# Patient Record
Sex: Male | Born: 2000 | Race: Black or African American | Hispanic: No | Marital: Single | Smoking: Never smoker
Health system: Southern US, Community
[De-identification: ages and names within clinical notes are randomized; demographics above are authoritative.]

---

## 2020-04-18 ENCOUNTER — Emergency Department (HOSPITAL_COMMUNITY): Payer: Self-pay

## 2020-04-18 ENCOUNTER — Emergency Department (HOSPITAL_COMMUNITY)
Admission: EM | Admit: 2020-04-18 | Discharge: 2020-04-18 | Disposition: A | Discharge status: Home / Self Care | Payer: Self-pay | Attending: Emergency Medicine | Admitting: Emergency Medicine

## 2020-04-18 ENCOUNTER — Encounter (HOSPITAL_COMMUNITY): Payer: Self-pay

## 2020-04-18 DIAGNOSIS — W500XXA Accidental hit or strike by another person, initial encounter: Secondary | ICD-10-CM | POA: Insufficient documentation

## 2020-04-18 DIAGNOSIS — S60222A Contusion of left hand, initial encounter: Secondary | ICD-10-CM

## 2020-04-18 DIAGNOSIS — M79642 Pain in left hand: Secondary | ICD-10-CM | POA: Insufficient documentation

## 2020-04-18 DIAGNOSIS — T1490XA Injury, unspecified, initial encounter: Secondary | ICD-10-CM
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
## 2021-12-23 ENCOUNTER — Ambulatory Visit (HOSPITAL_COMMUNITY): Admission: EM | Admit: 2021-12-23 | Payer: Self-pay

## 2021-12-23 ENCOUNTER — Encounter (HOSPITAL_COMMUNITY): Payer: Self-pay

## 2021-12-23 ENCOUNTER — Ambulatory Visit (HOSPITAL_COMMUNITY)
Admission: EM | Admit: 2021-12-23 | Discharge: 2021-12-23 | Disposition: A | Discharge status: Home / Self Care | Payer: Self-pay | Attending: Physician Assistant | Admitting: Physician Assistant

## 2021-12-23 DIAGNOSIS — J069 Acute upper respiratory infection, unspecified: Secondary | ICD-10-CM

## 2021-12-23 MED ORDER — FLUTICASONE PROPIONATE 50 MCG/ACT NA SUSP: 1.0000 | Freq: Every day | NASAL | 0 refills | Status: DC

## 2021-12-23 MED ORDER — PROMETHAZINE-DM 6.25-15 MG/5ML PO SYRP: 5.0000 mL | ORAL_SOLUTION | Freq: Two times a day (BID) | ORAL | 0 refills | Status: DC | PRN

## 2021-12-23 NOTE — ED Provider Notes (Signed)
[facility]    CSN: [id number] Arrival date & time: 12/23/21  1552      History   Chief Complaint Chief Complaint  Patient presents with   Generalized Body Aches    HPI Jason Brady is a 21 y.o. male.   Patient presents today with 3-day history of URI symptoms including body aches, chills, headache, cough, congestion.  Denies any chest pain, shortness of breath, nausea, vomiting, diarrhea.  Denies any known sick contacts.  He has been using herbal teas and Mucinex without improvement of symptoms.  Denies any significant past medical history including allergies, asthma, COPD, smoking.  Denies any recent antibiotics or steroids.  He has missed work as a result of symptoms.  He has not had COVID-19.  He has not had COVID-vaccine.    History reviewed. No pertinent past medical history.  There are no problems to display for this patient.   History reviewed. No pertinent surgical history.     Home Medications    Prior to Admission medications   Medication Sig Start Date End Date Taking? Authorizing Provider  fluticasone (FLONASE) 50 MCG/ACT nasal spray Place 1 spray into both nostrils daily. 12/23/21  Yes Tanae Petrosky K, PA-C  promethazine-dextromethorphan (PROMETHAZINE-DM) 6.25-15 MG/5ML syrup Take 5 mLs by mouth 2 (two) times daily as needed for cough. 12/23/21  Yes Rhayne Chatwin, Noberto Retort, PA-C    Family History History reviewed. No pertinent family history.  Social History Social History   Tobacco Use   Smoking status: Never   Smokeless tobacco: Never  Substance Use Topics   Alcohol use: Never   Drug use: Never     Allergies   Patient has no known allergies.   Review of Systems Review of Systems  Constitutional:  Positive for activity change. Negative for appetite change, fatigue and fever.  HENT:  Positive for congestion and sore throat. Negative for sinus pressure and sneezing.   Respiratory:  Positive for cough. Negative for shortness of breath.    Cardiovascular:  Negative for chest pain.  Gastrointestinal:  Negative for abdominal pain, diarrhea, nausea and vomiting.  Musculoskeletal:  Positive for arthralgias and myalgias.     Physical Exam Triage Vital Signs ED Triage Vitals  Enc Vitals Group     BP 12/23/21 1637 118/86     Pulse Rate 12/23/21 1637 81     Resp 12/23/21 1637 18     Temp 12/23/21 1637 98.7 F (37.1 C)     Temp Source 12/23/21 1637 Oral     SpO2 12/23/21 1637 100 %     Weight --      Height --      Head Circumference --      Peak Flow --      Pain Score 12/23/21 1638 3     Pain Loc --      Pain Edu? --      Excl. in GC? --    No data found.  Updated Vital Signs BP 118/86 (BP Location: Left Arm)   Pulse 81   Temp 98.7 F (37.1 C) (Oral)   Resp 18   SpO2 100%   Visual Acuity Right Eye Distance:   Left Eye Distance:   Bilateral Distance:    Right Eye Near:   Left Eye Near:    Bilateral Near:     Physical Exam Vitals reviewed.  Constitutional:      General: He is awake.     Appearance: Normal appearance. He is well-developed. He is  not ill-appearing.     Comments: Very pleasant male appears stated age in no acute distress sitting comfortably in exam room  HENT:     Head: Normocephalic and atraumatic.     Right Ear: Tympanic membrane, ear canal and external ear normal. Tympanic membrane is not erythematous or bulging.     Left Ear: Tympanic membrane, ear canal and external ear normal. Tympanic membrane is not erythematous or bulging.     Nose: Nose normal.     Mouth/Throat:     Pharynx: Uvula midline. No oropharyngeal exudate or posterior oropharyngeal erythema.  Cardiovascular:     Rate and Rhythm: Normal rate and regular rhythm.     Heart sounds: Normal heart sounds, S1 normal and S2 normal. No murmur heard. Pulmonary:     Effort: Pulmonary effort is normal. No accessory muscle usage or respiratory distress.     Breath sounds: Normal breath sounds. No stridor. No wheezing, rhonchi  or rales.     Comments: Clear to auscultation breath really Abdominal:     General: Bowel sounds are normal.     Palpations: Abdomen is soft.     Tenderness: There is no abdominal tenderness.  Neurological:     Mental Status: He is alert.  Psychiatric:        Behavior: Behavior is cooperative.      UC Treatments / Results  Labs (all labs ordered are listed, but only abnormal results are displayed) Labs Reviewed  RESP PANEL BY RT-PCR (FLU A&B, COVID) [id number]    EKG   Radiology No results found.  Procedures Procedures (including critical care time)  Medications Ordered in UC Medications - No data to display  Initial Impression / Assessment and Plan / UC Course  I have reviewed the triage vital signs and the nursing notes.  Pertinent labs & imaging results that were available during my care of the patient were reviewed by me and considered in my medical decision making (see chart for details).     Patient is well-appearing, afebrile, nontoxic, nontachycardic.  No evidence of acute infection of physical exam that would warrant initiation of antibiotics.  COVID and flu testing was obtained today-results pending.  Patient will monitor his MyChart for these results and we discussed that we will contact him if he is positive.  He is healthy with no significant risk factors will defer antiviral therapy.  He is outside the window of effectiveness for Tamiflu.  He was prescribed Promethazine DM for cough.  Discussed that this is sedating and he should not drive or drink alcohol with taking it.  He can use Flonase for congestion.  Recommend over-the-counter medications.  He is to rest and drink plenty of fluid.  If his symptoms persist or worsen in any way he is to return for reevaluation.  Work excuse note with current CDC return to work guidelines based on COVID test result provided.  Final Clinical Impressions(s) / UC Diagnoses   Final diagnoses:  Upper respiratory tract infection,  unspecified type     Discharge Instructions      Monitor your MyChart for your results.  We will contact you if you are positive for flu or COVID.  Use Promethazine DM for cough.  This will make you sleepy do not drive or drink alcohol with taking it.  Use Flonase for congestion.  You can use over-the-counter medication for additional symptom relief.  Make sure you rest and drink plenty of fluid.  If your symptoms are not improving by  next week please return for reevaluation.  If anything worsens be seen immediately.     ED Prescriptions     Medication Sig Dispense Auth. Provider   promethazine-dextromethorphan (PROMETHAZINE-DM) 6.25-15 MG/5ML syrup Take 5 mLs by mouth 2 (two) times daily as needed for cough. 118 mL Medford Staheli K, PA-C   fluticasone (FLONASE) 50 MCG/ACT nasal spray Place 1 spray into both nostrils daily. 16 g Brailynn Breth K, PA-C      PDMP not reviewed this encounter.   Terrilee Croak, PA-C 12/23/21 1702

## 2021-12-23 NOTE — ED Triage Notes (Signed)
Pt c/o body aches, chills, and minor headaches since  yesterday. States drinking herbal tea and taking OTC meds.

## 2021-12-23 NOTE — Discharge Instructions (Signed)
Monitor your MyChart for your results.  We will contact you if you are positive for flu or COVID.  Use Promethazine DM for cough.  This will make you sleepy do not drive or drink alcohol with taking it.  Use Flonase for congestion.  You can use over-the-counter medication for additional symptom relief.  Make sure you rest and drink plenty of fluid.  If your symptoms are not improving by next week please return for reevaluation.  If anything worsens be seen immediately.

## 2022-01-01 ENCOUNTER — Telehealth (HOSPITAL_COMMUNITY): Payer: Self-pay | Admitting: Emergency Medicine

## 2022-01-01 NOTE — Telephone Encounter (Signed)
I got notification today that they were unable to get a result for patient's COVID test.   It is 10 days since his visit.   Attempted to reach patient x 1, LVM

## 2022-01-01 NOTE — Telephone Encounter (Signed)
Attempted to reach patient x 2, person who answered states he is unavailable

## 2022-09-19 ENCOUNTER — Encounter (HOSPITAL_COMMUNITY)
Admission: EM | Disposition: A | Discharge status: Home / Self Care | Payer: Self-pay | Source: Home / Self Care | Attending: Emergency Medicine

## 2022-09-19 ENCOUNTER — Encounter (HOSPITAL_COMMUNITY): Payer: Self-pay

## 2022-09-19 ENCOUNTER — Emergency Department (HOSPITAL_COMMUNITY): Payer: Self-pay

## 2022-09-19 ENCOUNTER — Emergency Department (HOSPITAL_COMMUNITY): Payer: Self-pay | Admitting: Anesthesiology

## 2022-09-19 ENCOUNTER — Emergency Department (EMERGENCY_DEPARTMENT_HOSPITAL): Payer: Self-pay | Admitting: Anesthesiology

## 2022-09-19 ENCOUNTER — Ambulatory Visit (HOSPITAL_COMMUNITY)
Admission: EM | Admit: 2022-09-19 | Discharge: 2022-09-19 | Disposition: A | Discharge status: Home / Self Care | Payer: Self-pay | Attending: Emergency Medicine | Admitting: Emergency Medicine

## 2022-09-19 ENCOUNTER — Other Ambulatory Visit: Payer: Self-pay

## 2022-09-19 DIAGNOSIS — W3400XA Accidental discharge from unspecified firearms or gun, initial encounter: Secondary | ICD-10-CM

## 2022-09-19 DIAGNOSIS — S71131A Puncture wound without foreign body, right thigh, initial encounter: Secondary | ICD-10-CM | POA: Insufficient documentation

## 2022-09-19 DIAGNOSIS — Z23 Encounter for immunization: Secondary | ICD-10-CM | POA: Insufficient documentation

## 2022-09-19 DIAGNOSIS — S81831A Puncture wound without foreign body, right lower leg, initial encounter: Secondary | ICD-10-CM

## 2022-09-19 DIAGNOSIS — S75091A Other specified injury of femoral artery, right leg, initial encounter: Secondary | ICD-10-CM

## 2022-09-19 HISTORY — PX: FEMORAL-TIBIAL BYPASS GRAFT: SHX938

## 2022-09-19 HISTORY — PX: ULTRASOUND GUIDANCE FOR VASCULAR ACCESS: SHX6516

## 2022-09-19 HISTORY — PX: AORTOGRAM: SHX6300

## 2022-09-19 HISTORY — PX: LOWER EXTREMITY ANGIOGRAM: SHX5508

## 2022-09-19 LAB — CBC WITH DIFFERENTIAL/PLATELET
Abs Immature Granulocytes: 0.02 10*3/uL (ref 0.00–0.07)
Basophils Absolute: 0 10*3/uL (ref 0.0–0.1)
Basophils Relative: 0 %
Eosinophils Absolute: 0.1 10*3/uL (ref 0.0–0.5)
Eosinophils Relative: 1 %
HCT: 41.3 % (ref 39.0–52.0)
Hemoglobin: 13.1 g/dL (ref 13.0–17.0)
Immature Granulocytes: 0 %
Lymphocytes Relative: 25 %
Lymphs Abs: 1.9 10*3/uL (ref 0.7–4.0)
MCH: 25.5 pg — ABNORMAL LOW (ref 26.0–34.0)
MCHC: 31.7 g/dL (ref 30.0–36.0)
MCV: 80.4 fL (ref 80.0–100.0)
Monocytes Absolute: 0.8 10*3/uL (ref 0.1–1.0)
Monocytes Relative: 10 %
Neutro Abs: 4.7 10*3/uL (ref 1.7–7.7)
Neutrophils Relative %: 64 %
Platelets: 216 10*3/uL (ref 150–400)
RBC: 5.14 MIL/uL (ref 4.22–5.81)
RDW: 13.8 % (ref 11.5–15.5)
WBC: 7.6 10*3/uL (ref 4.0–10.5)
nRBC: 0 % (ref 0.0–0.2)

## 2022-09-19 LAB — I-STAT CHEM 8, ED
BUN: 10 mg/dL (ref 6–20)
Calcium, Ion: 1.18 mmol/L (ref 1.15–1.40)
Chloride: 103 mmol/L (ref 98–111)
Creatinine, Ser: 1 mg/dL (ref 0.61–1.24)
Glucose, Bld: 92 mg/dL (ref 70–99)
HCT: 42 % (ref 39.0–52.0)
Hemoglobin: 14.3 g/dL (ref 13.0–17.0)
Potassium: 4.3 mmol/L (ref 3.5–5.1)
Sodium: 139 mmol/L (ref 135–145)
TCO2: 24 mmol/L (ref 22–32)

## 2022-09-19 LAB — ABO/RH: ABO/RH(D): O POS

## 2022-09-19 SURGERY — ANGIOGRAM, LOWER EXTREMITY
Anesthesia: General | Site: Leg Lower | Laterality: Right

## 2022-09-19 MED ORDER — SODIUM CHLORIDE 0.9 % IV SOLN: 250.0000 mL | INTRAVENOUS | Status: DC | PRN

## 2022-09-19 MED ORDER — OXYCODONE-ACETAMINOPHEN 5-325 MG PO TABS
1.0000 | ORAL_TABLET | Freq: Once | ORAL | Status: AC
  Administered 2022-09-19: 1 via ORAL
  Filled 2022-09-19: qty 1

## 2022-09-19 MED ORDER — FENTANYL CITRATE (PF) 250 MCG/5ML IJ SOLN
INTRAMUSCULAR | Status: AC
Start: 2022-09-19 — End: ?
  Filled 2022-09-19: qty 5

## 2022-09-19 MED ORDER — 0.9 % SODIUM CHLORIDE (POUR BTL) OPTIME
TOPICAL | Status: DC | PRN
  Administered 2022-09-19: 1000 mL

## 2022-09-19 MED ORDER — MIDAZOLAM HCL 2 MG/2ML IJ SOLN
INTRAMUSCULAR | Status: AC
  Filled 2022-09-19: qty 2

## 2022-09-19 MED ORDER — SODIUM CHLORIDE (PF) 0.9 % IJ SOLN
INTRAVENOUS | Status: DC | PRN
  Administered 2022-09-19: 32 mL via INTRAMUSCULAR

## 2022-09-19 MED ORDER — SODIUM CHLORIDE 0.9 % WEIGHT BASED INFUSION: 1.0000 mL/kg/h | INTRAVENOUS | Status: DC

## 2022-09-19 MED ORDER — ACETAMINOPHEN 325 MG PO TABS: 650.0000 mg | ORAL_TABLET | ORAL | Status: DC | PRN

## 2022-09-19 MED ORDER — CEFAZOLIN SODIUM-DEXTROSE 1-4 GM/50ML-% IV SOLN
1.0000 g | Freq: Once | INTRAVENOUS | Status: AC
  Administered 2022-09-19: 1 g via INTRAVENOUS
  Filled 2022-09-19: qty 50

## 2022-09-19 MED ORDER — LACTATED RINGERS IV SOLN: INTRAVENOUS | Status: DC | PRN

## 2022-09-19 MED ORDER — HYDROMORPHONE HCL 1 MG/ML IJ SOLN: 0.2500 mg | INTRAMUSCULAR | Status: DC | PRN

## 2022-09-19 MED ORDER — PROPOFOL 10 MG/ML IV BOLUS
INTRAVENOUS | Status: AC
Start: 2022-09-19 — End: ?
  Filled 2022-09-19: qty 20

## 2022-09-19 MED ORDER — FENTANYL CITRATE (PF) 250 MCG/5ML IJ SOLN
INTRAMUSCULAR | Status: DC | PRN
  Administered 2022-09-19: 50 ug via INTRAVENOUS

## 2022-09-19 MED ORDER — SUCCINYLCHOLINE CHLORIDE 200 MG/10ML IV SOSY
PREFILLED_SYRINGE | INTRAVENOUS | Status: DC | PRN
  Administered 2022-09-19: 120 mg via INTRAVENOUS

## 2022-09-19 MED ORDER — ONDANSETRON HCL 4 MG/2ML IJ SOLN
INTRAMUSCULAR | Status: DC | PRN
  Administered 2022-09-19: 4 mg via INTRAVENOUS

## 2022-09-19 MED ORDER — HEPARIN SODIUM (PORCINE) 1000 UNIT/ML IJ SOLN
INTRAMUSCULAR | Status: DC | PRN
Start: 2022-09-19 — End: 2022-09-19
  Administered 2022-09-19: 8000 [IU] via INTRAVENOUS

## 2022-09-19 MED ORDER — PROPOFOL 10 MG/ML IV BOLUS
INTRAVENOUS | Status: DC | PRN
Start: 2022-09-19 — End: 2022-09-19
  Administered 2022-09-19: 130 mg via INTRAVENOUS

## 2022-09-19 MED ORDER — ASPIRIN 325 MG PO TABS
325.0000 mg | ORAL_TABLET | Freq: Once | ORAL | Status: AC
  Administered 2022-09-19: 325 mg via ORAL
  Filled 2022-09-19: qty 1

## 2022-09-19 MED ORDER — HEPARIN 6000 UNIT IRRIGATION SOLUTION
Status: DC | PRN
  Administered 2022-09-19: 1

## 2022-09-19 MED ORDER — ONDANSETRON HCL 4 MG/2ML IJ SOLN: 4.0000 mg | Freq: Four times a day (QID) | INTRAMUSCULAR | Status: DC | PRN

## 2022-09-19 MED ORDER — LABETALOL HCL 5 MG/ML IV SOLN: 10.0000 mg | INTRAVENOUS | Status: DC | PRN

## 2022-09-19 MED ORDER — HYDRALAZINE HCL 20 MG/ML IJ SOLN: 5.0000 mg | INTRAMUSCULAR | Status: DC | PRN

## 2022-09-19 MED ORDER — IOHEXOL 350 MG/ML SOLN
100.0000 mL | Freq: Once | INTRAVENOUS | Status: AC | PRN
Start: 2022-09-19 — End: 2022-09-19
  Administered 2022-09-19: 100 mL via INTRAVENOUS

## 2022-09-19 MED ORDER — SODIUM CHLORIDE 0.9% FLUSH: 3.0000 mL | INTRAVENOUS | Status: DC | PRN

## 2022-09-19 MED ORDER — PHENYLEPHRINE HCL-NACL 20-0.9 MG/250ML-% IV SOLN
INTRAVENOUS | Status: DC | PRN
  Administered 2022-09-19: 50 ug/min via INTRAVENOUS

## 2022-09-19 MED ORDER — DEXAMETHASONE SODIUM PHOSPHATE 10 MG/ML IJ SOLN
INTRAMUSCULAR | Status: DC | PRN
  Administered 2022-09-19: 10 mg via INTRAVENOUS

## 2022-09-19 MED ORDER — HEPARIN 6000 UNIT IRRIGATION SOLUTION
Status: AC
  Filled 2022-09-19: qty 500

## 2022-09-19 MED ORDER — LIDOCAINE 2% (20 MG/ML) 5 ML SYRINGE
INTRAMUSCULAR | Status: DC | PRN
  Administered 2022-09-19: 60 mg via INTRAVENOUS

## 2022-09-19 MED ORDER — CLOPIDOGREL BISULFATE 300 MG PO TABS
300.0000 mg | ORAL_TABLET | Freq: Once | ORAL | Status: AC
  Administered 2022-09-19: 300 mg via ORAL
  Filled 2022-09-19: qty 1

## 2022-09-19 MED ORDER — TETANUS-DIPHTH-ACELL PERTUSSIS 5-2.5-18.5 LF-MCG/0.5 IM SUSY
0.5000 mL | PREFILLED_SYRINGE | Freq: Once | INTRAMUSCULAR | Status: AC
Start: 2022-09-19 — End: 2022-09-19
  Administered 2022-09-19: 0.5 mL via INTRAMUSCULAR
  Filled 2022-09-19: qty 0.5

## 2022-09-19 MED ORDER — SODIUM CHLORIDE 0.9% FLUSH: 3.0000 mL | Freq: Two times a day (BID) | INTRAVENOUS | Status: DC

## 2022-09-19 MED ORDER — ASPIRIN 81 MG PO TBEC: 81.0000 mg | DELAYED_RELEASE_TABLET | Freq: Every day | ORAL | 2 refills | Status: AC

## 2022-09-19 MED ORDER — MIDAZOLAM HCL 2 MG/2ML IJ SOLN
INTRAMUSCULAR | Status: DC | PRN
  Administered 2022-09-19: 2 mg via INTRAVENOUS

## 2022-09-19 MED ORDER — CLOPIDOGREL BISULFATE 75 MG PO TABS: 75.0000 mg | ORAL_TABLET | Freq: Every day | ORAL | 11 refills | Status: AC

## 2022-09-19 SURGICAL SUPPLY — 78 items
APL PRP STRL LF DISP 70% ISPRP (MISCELLANEOUS) ×8
APL SKNCLS STERI-STRIP NONHPOA (GAUZE/BANDAGES/DRESSINGS) ×12
BAG COUNTER SPONGE SURGICOUNT (BAG) ×4 IMPLANT
BAG SPNG CNTER NS LX DISP (BAG) ×4
BANDAGE ESMARK 6X9 LF (GAUZE/BANDAGES/DRESSINGS) IMPLANT
BENZOIN TINCTURE PRP APPL 2/3 (GAUZE/BANDAGES/DRESSINGS) ×12 IMPLANT
BNDG CMPR 9X6 STRL LF SNTH (GAUZE/BANDAGES/DRESSINGS)
BNDG ESMARK 6X9 LF (GAUZE/BANDAGES/DRESSINGS)
CANISTER SUCT 3000ML PPV (MISCELLANEOUS) ×4 IMPLANT
CANNULA VESSEL 3MM 2 BLNT TIP (CANNULA) ×4 IMPLANT
CATH ANGIO 5F BER 100CM (CATHETERS) IMPLANT
CATH OMNI FLUSH 5F 65CM (CATHETERS) IMPLANT
CHLORAPREP W/TINT 26 (MISCELLANEOUS) ×8 IMPLANT
CLOSURE PERCLOSE PROSTYLE (VASCULAR PRODUCTS) IMPLANT
COVER MAYO STAND STRL (DRAPES) IMPLANT
CUFF TOURN SGL QUICK 24 (TOURNIQUET CUFF)
CUFF TOURN SGL QUICK 34 (TOURNIQUET CUFF)
CUFF TOURN SGL QUICK 42 (TOURNIQUET CUFF) IMPLANT
CUFF TRNQT CYL 24X4X16.5-23 (TOURNIQUET CUFF) IMPLANT
CUFF TRNQT CYL 34X4.125X (TOURNIQUET CUFF) IMPLANT
DRAIN CHANNEL 15F RND FF W/TCR (WOUND CARE) IMPLANT
DRAIN RELI 100 BL SUC LF ST (DRAIN)
DRAPE C-ARM 42X72 X-RAY (DRAPES) IMPLANT
DRAPE HALF SHEET 40X57 (DRAPES) IMPLANT
DRAPE X-RAY CASS 24X20 (DRAPES) IMPLANT
DRSG TEGADERM 2-3/8X2-3/4 SM (GAUZE/BANDAGES/DRESSINGS) IMPLANT
ELECT REM PT RETURN 9FT ADLT (ELECTROSURGICAL) ×4
ELECTRODE REM PT RTRN 9FT ADLT (ELECTROSURGICAL) ×4 IMPLANT
EVACUATOR SILICONE 100CC (DRAIN) IMPLANT
GAUZE 4X4 16PLY ~~LOC~~+RFID DBL (SPONGE) IMPLANT
GAUZE SPONGE 2X2 8PLY STRL LF (GAUZE/BANDAGES/DRESSINGS) IMPLANT
GAUZE SPONGE 4X4 12PLY STRL (GAUZE/BANDAGES/DRESSINGS) ×4 IMPLANT
GLIDEWIRE ADV .035X180CM (WIRE) IMPLANT
GLOVE BIO SURGEON STRL SZ8 (GLOVE) ×12 IMPLANT
GOWN STRL REUS W/ TWL LRG LVL3 (GOWN DISPOSABLE) ×8 IMPLANT
GOWN STRL REUS W/ TWL XL LVL3 (GOWN DISPOSABLE) ×4 IMPLANT
GOWN STRL REUS W/TWL LRG LVL3 (GOWN DISPOSABLE) ×8
GOWN STRL REUS W/TWL XL LVL3 (GOWN DISPOSABLE) ×4
HEAD CUTTING VALVULOTOME LEMTR (VASCULAR PRODUCTS) IMPLANT
HEMOSTAT SNOW SURGICEL 2X4 (HEMOSTASIS) IMPLANT
INSERT FOGARTY SM (MISCELLANEOUS) IMPLANT
KIT BASIN OR (CUSTOM PROCEDURE TRAY) ×4 IMPLANT
KIT TURNOVER KIT B (KITS) ×4 IMPLANT
MARKER GRAFT CORONARY BYPASS (MISCELLANEOUS) IMPLANT
NS IRRIG 1000ML POUR BTL (IV SOLUTION) ×8 IMPLANT
PACK PERIPHERAL VASCULAR (CUSTOM PROCEDURE TRAY) ×4 IMPLANT
PAD ARMBOARD 7.5X6 YLW CONV (MISCELLANEOUS) ×8 IMPLANT
SET COLLECT BLD 21X3/4 12 (NEEDLE) IMPLANT
SET MICROPUNCTURE 5F STIFF (MISCELLANEOUS) IMPLANT
SET WALTER ACTIVATION W/DRAPE (SET/KITS/TRAYS/PACK) IMPLANT
SHEATH FLEXOR ANSEL 1 7F 45CM (SHEATH) IMPLANT
SHEATH PINNACLE 5F 10CM (SHEATH) IMPLANT
STENT VIABAHN 8X50X120 (Permanent Stent) IMPLANT
STENT VIABAHN5X120X8X (Permanent Stent) ×4 IMPLANT
STOPCOCK 4 WAY LG BORE MALE ST (IV SETS) IMPLANT
STOPCOCK MORSE 400PSI 3WAY (MISCELLANEOUS) IMPLANT
STRIP CLOSURE SKIN 1/2X4 (GAUZE/BANDAGES/DRESSINGS) ×12 IMPLANT
SUT ETHILON 3 0 PS 1 (SUTURE) IMPLANT
SUT MNCRL AB 4-0 PS2 18 (SUTURE) ×8 IMPLANT
SUT PROLENE 5 0 C 1 24 (SUTURE) ×4 IMPLANT
SUT PROLENE 6 0 BV (SUTURE) ×4 IMPLANT
SUT SILK 2 0 SH (SUTURE) ×4 IMPLANT
SUT SILK 3 0 (SUTURE)
SUT SILK 3-0 18XBRD TIE 12 (SUTURE) IMPLANT
SUT VIC AB 2-0 CT1 27 (SUTURE) ×8
SUT VIC AB 2-0 CT1 TAPERPNT 27 (SUTURE) ×8 IMPLANT
SUT VIC AB 3-0 SH 27 (SUTURE) ×8
SUT VIC AB 3-0 SH 27X BRD (SUTURE) ×8 IMPLANT
TAPE CLOTH SURG 4X10 WHT LF (GAUZE/BANDAGES/DRESSINGS) IMPLANT
TAPE UMBILICAL 1/8X30 (MISCELLANEOUS) ×4 IMPLANT
TOWEL GREEN STERILE (TOWEL DISPOSABLE) ×4 IMPLANT
TRAY FOLEY MTR SLVR 16FR STAT (SET/KITS/TRAYS/PACK) ×4 IMPLANT
TUBING INJECTOR 48 (MISCELLANEOUS) IMPLANT
UNDERPAD 30X36 HEAVY ABSORB (UNDERPADS AND DIAPERS) ×4 IMPLANT
VALVULOTOME HEAD CUTTING LEMTR (VASCULAR PRODUCTS) IMPLANT
VALVULOTOME LEMAITRE (VASCULAR PRODUCTS)
WATER STERILE IRR 1000ML POUR (IV SOLUTION) ×4 IMPLANT
WIRE G V18X300 ST (WIRE) IMPLANT
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
## 2022-09-19 NOTE — Anesthesia Procedure Notes (Signed)
Procedure Name: Intubation Date/Time: 09/19/2022 6:49 PM  Performed by: Randon Goldsmith, CRNAPre-anesthesia Checklist: Patient identified, Emergency Drugs available, Suction available, Patient being monitored and Timeout performed Patient Re-evaluated:Patient Re-evaluated prior to induction Oxygen Delivery Method: Circle system utilized Preoxygenation: Pre-oxygenation with 100% oxygen Induction Type: IV induction and Rapid sequence Laryngoscope Size: Mac and 4 Grade View: Grade I Tube type: Oral Tube size: 7.5 mm Number of attempts: 1 Airway Equipment and Method: Stylet Placement Confirmation: ETT inserted through vocal cords under direct vision, positive ETCO2 and breath sounds checked- equal and bilateral Secured at: 23 cm Tube secured with: Tape Dental Injury: Teeth and Oropharynx as per pre-operative assessment
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
## 2022-09-19 NOTE — Anesthesia Postprocedure Evaluation (Signed)
Anesthesia Post Note  Patient: Jason Brady  Procedure(s) Performed: LOWER EXTREMITY ANGIOGRAM (Bilateral: Leg Lower) right superficial femoral artery stenting. (Right: Leg Lower) AORTOGRAM (Abdomen) ULTRASOUND GUIDANCE FOR VASCULAR ACCESS (Left: Groin)     Patient location during evaluation: PACU Anesthesia Type: General Level of consciousness: awake and alert Pain management: pain level controlled Vital Signs Assessment: post-procedure vital signs reviewed and stable Respiratory status: spontaneous breathing, nonlabored ventilation and respiratory function stable Cardiovascular status: blood pressure returned to baseline and stable Postop Assessment: no apparent nausea or vomiting Anesthetic complications: no  No notable events documented.  Last Vitals:  Vitals:   09/19/22 2005 09/19/22 2020  BP: 137/76 129/71  Pulse: 75 73  Resp: 18 19  Temp:    SpO2: 94% 94%    Last Pain:  Vitals:   09/19/22 2020  TempSrc:   PainSc: Asleep                 Radhika Dershem,W. EDMOND
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
## 2022-09-19 NOTE — Anesthesia Preprocedure Evaluation (Addendum)
Anesthesia Evaluation  Patient identified by MRN, date of birth, ID band Patient awake    Reviewed: Allergy & Precautions, H&P , NPO status , Patient's Chart, lab work & pertinent test results  Airway Mallampati: II  TM Distance: >3 FB Neck ROM: Full    Dental no notable dental hx. (+) Teeth Intact, Dental Advisory Given   Pulmonary neg pulmonary ROS   Pulmonary exam normal breath sounds clear to auscultation       Cardiovascular negative cardio ROS  Rhythm:Regular Rate:Normal     Neuro/Psych negative neurological ROS  negative psych ROS   GI/Hepatic negative GI ROS, Neg liver ROS,,,  Endo/Other  negative endocrine ROS    Renal/GU negative Renal ROS  negative genitourinary   Musculoskeletal   Abdominal   Peds  Hematology negative hematology ROS (+)   Anesthesia Other Findings   Reproductive/Obstetrics negative OB ROS                             Anesthesia Physical Anesthesia Plan  ASA: 1 and emergent  Anesthesia Plan: General   Post-op Pain Management: Precedex   Induction: Intravenous, Rapid sequence and Cricoid pressure planned  PONV Risk Score and Plan: 3 and Ondansetron, Dexamethasone and Midazolam  Airway Management Planned: Oral ETT  Additional Equipment:   Intra-op Plan:   Post-operative Plan: Extubation in OR  Informed Consent: I have reviewed the patients History and Physical, chart, labs and discussed the procedure including the risks, benefits and alternatives for the proposed anesthesia with the patient or authorized representative who has indicated his/her understanding and acceptance.     Dental advisory given  Plan Discussed with: CRNA  Anesthesia Plan Comments:        Anesthesia Quick Evaluation
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
## 2022-09-19 NOTE — Transfer of Care (Signed)
Immediate Anesthesia Transfer of Care Note  Patient: Jason Brady  Procedure(s) Performed: LOWER EXTREMITY ANGIOGRAM (Bilateral: Leg Lower) right superficial femoral artery stenting. (Right: Leg Lower) AORTOGRAM (Abdomen) ULTRASOUND GUIDANCE FOR VASCULAR ACCESS (Left: Groin)  Patient Location: PACU  Anesthesia Type:General  Level of Consciousness: awake and responds to stimulation  Airway & Oxygen Therapy: Patient Spontanous Breathing and Patient connected to face mask oxygen  Post-op Assessment: Report given to RN, Post -op Vital signs reviewed and stable, and Patient moving all extremities X 4  Post vital signs: Reviewed and stable  Last Vitals:  Vitals Value Taken Time  BP 105/92 09/19/22 1949  Temp    Pulse 96 09/19/22 1953  Resp 22 09/19/22 1953  SpO2 97 % 09/19/22 1953  Vitals shown include unfiled device data.  Last Pain:  Vitals:   09/19/22 1531  TempSrc: Oral  PainSc:          Complications: No notable events documented.
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
## 2022-09-20 ENCOUNTER — Telehealth: Payer: Self-pay | Admitting: Vascular Surgery

## 2022-09-20 ENCOUNTER — Encounter (HOSPITAL_COMMUNITY): Payer: Self-pay | Admitting: Vascular Surgery

## 2022-09-20 NOTE — Telephone Encounter (Signed)
-----   Message from Leonie Douglas sent at 09/19/2022  7:37 PM EDT ----- Gennette Pac 09/19/2022 Procedure:  1) Ultrasound guided left common femoral artery access 2) Aortogram 3) Right lower extremity angiogram with second order cannulation (45mL total contrast) 4) Right superficial femoral artery stenting (8x37mm Viabahn)  Assistant: none Follow up: 4 weeks Studies for follow up: ABI / RLE duplex  Thank you! Elijah Birk

## 2022-09-20 NOTE — Telephone Encounter (Signed)
-----   Message from Leonie Douglas sent at 09/19/2022  7:40 PM EDT ----- Jason Brady 09/19/2022 Procedure:   1) Ultrasound guided left common femoral artery access 2) Aortogram 3) Right lower extremity angiogram with second order cannulation (45mL total contrast) 4) Right superficial femoral artery stenting (8x20mm Viabahn)  Assistant: none Follow up: 4 weeks with me Studies for follow up: ABI / RLE duplex  Thank you! Tom.

## 2022-10-06 NOTE — Telephone Encounter (Signed)
Appt has been scheduled.

## 2022-10-07 ENCOUNTER — Other Ambulatory Visit: Payer: Self-pay | Admitting: *Deleted

## 2022-10-07 DIAGNOSIS — W3400XD Accidental discharge from unspecified firearms or gun, subsequent encounter: Secondary | ICD-10-CM

## 2022-10-11 ENCOUNTER — Ambulatory Visit (HOSPITAL_COMMUNITY): Payer: Self-pay

## 2022-10-11 ENCOUNTER — Ambulatory Visit (HOSPITAL_COMMUNITY): Payer: Self-pay | Attending: Surgery

## 2022-10-12 ENCOUNTER — Ambulatory Visit (HOSPITAL_COMMUNITY): Payer: Self-pay

## 2022-10-13 ENCOUNTER — Ambulatory Visit (INDEPENDENT_AMBULATORY_CARE_PROVIDER_SITE_OTHER)
Admission: RE | Admit: 2022-10-13 | Discharge: 2022-10-13 | Disposition: A | Discharge status: Home / Self Care | Payer: Self-pay | Source: Ambulatory Visit | Attending: Vascular Surgery | Admitting: Vascular Surgery

## 2022-10-13 ENCOUNTER — Ambulatory Visit (HOSPITAL_COMMUNITY)
Admission: RE | Admit: 2022-10-13 | Discharge: 2022-10-13 | Disposition: A | Discharge status: Home / Self Care | Payer: Self-pay | Source: Ambulatory Visit | Attending: Vascular Surgery | Admitting: Vascular Surgery

## 2022-10-13 DIAGNOSIS — M79604 Pain in right leg: Secondary | ICD-10-CM

## 2022-10-13 DIAGNOSIS — W3400XA Accidental discharge from unspecified firearms or gun, initial encounter: Secondary | ICD-10-CM | POA: Insufficient documentation

## 2022-10-13 DIAGNOSIS — W3400XD Accidental discharge from unspecified firearms or gun, subsequent encounter: Secondary | ICD-10-CM

## 2022-10-13 DIAGNOSIS — Z95820 Peripheral vascular angioplasty status with implants and grafts: Secondary | ICD-10-CM | POA: Insufficient documentation

## 2022-10-13 LAB — VAS US ABI WITH/WO TBI
Left ABI: 1.15
Right ABI: 1.08

## 2022-10-19 ENCOUNTER — Ambulatory Visit (INDEPENDENT_AMBULATORY_CARE_PROVIDER_SITE_OTHER): Payer: Self-pay | Admitting: Physician Assistant

## 2022-10-19 VITALS — BP 119/71 | HR 67 | Temp 98.0°F | Resp 18 | Ht 74.0 in | Wt 150.8 lb

## 2022-10-19 DIAGNOSIS — M79604 Pain in right leg: Secondary | ICD-10-CM

## 2022-10-19 DIAGNOSIS — W3400XD Accidental discharge from unspecified firearms or gun, subsequent encounter: Secondary | ICD-10-CM
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
## 2023-07-13 ENCOUNTER — Encounter: Payer: Self-pay | Admitting: Emergency Medicine

## 2023-07-13 ENCOUNTER — Ambulatory Visit (INDEPENDENT_AMBULATORY_CARE_PROVIDER_SITE_OTHER): Payer: Self-pay | Admitting: Radiology

## 2023-07-13 ENCOUNTER — Ambulatory Visit
Admission: EM | Admit: 2023-07-13 | Discharge: 2023-07-13 | Disposition: A | Discharge status: Home / Self Care | Payer: Self-pay | Attending: Family Medicine | Admitting: Family Medicine

## 2023-07-13 DIAGNOSIS — M79641 Pain in right hand: Secondary | ICD-10-CM
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
## 2023-07-13 NOTE — Discharge Instructions (Signed)
 As we discussed, I am concerned there is a broken bone in your hand.  Please wear the splint and follow up with the Orthopedic doctor - contact information has been provided for you to call and make an appointment.  In the meantime, recommend elevating your arm above the level of your heart, applying ice to the outside of the splint to help with pain and swelling.  You can also take Tylenol  or ibuprofen as needed for pain.
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
## 2023-07-14 ENCOUNTER — Encounter: Payer: Self-pay | Admitting: Physician Assistant

## 2023-07-14 ENCOUNTER — Ambulatory Visit (INDEPENDENT_AMBULATORY_CARE_PROVIDER_SITE_OTHER): Payer: Self-pay | Admitting: Physician Assistant

## 2023-07-14 DIAGNOSIS — S62344A Nondisplaced fracture of base of fourth metacarpal bone, right hand, initial encounter for closed fracture: Secondary | ICD-10-CM | POA: Insufficient documentation

## 2023-07-14 NOTE — Progress Notes (Signed)
 Office Visit Note   Patient: Jason Brady           Date of Birth: [birth year]           MRN: [id number] Visit Date: 07/14/2023              Requested by: No referring provider defined for this encounter. PCP: Patient, No Pcp Per   Assessment & Plan: Visit Diagnoses:  1. Nondisplaced fracture of base of fourth metacarpal bone, right hand, initial encounter for closed fracture     Plan: Patient is a 23 year old gentleman who is 2 days status post hitting a dumpster in brick wall.  He had pain and swelling over his ulnar gutter.  He was seen at urgent care x-rays suggested a nondisplaced fracture of the fourth metacarpal and possibly the fifth.  Certainly his exam is consistent with this.  Will remain in the ulnar gutter removable splint.  Will follow-up in 2 weeks for new x-rays.  Encouraged icing and elevation  Follow-Up Instructions: Return in about 2 weeks (around 07/28/2023).   Orders:  No orders of the defined types were placed in this encounter.  No orders of the defined types were placed in this encounter.     Procedures: No procedures performed   Clinical Data: No additional findings.   Subjective: No chief complaint on file.   HPI Pleasant 23 year old gentleman comes in today right-hand-dominant 2 days after punching a brick wall.  Denies any previous injury.  Was seen in urgent care told he probably had a fracture.  Has been in a splint. Review of Systems  All other systems reviewed and are negative.    Objective: Vital Signs: There were no vitals taken for this visit.  Physical Exam Pulmonary:     Effort: Pulmonary effort is normal.  Skin:    General: Skin is warm and dry.  Neurological:     General: No focal deficit present.     Mental Status: He is oriented to person, place, and time.  Psychiatric:        Mood and Affect: Mood normal.        Behavior: Behavior normal.     Ortho Exam Examination of his right hand his pulses intact he has  moderate soft tissue swelling but sensation is intact in all his fingers brisk capillary refill strong radial pulse he is acutely tender over the 4th and 5th metacarpals Specialty Comments:  No specialty comments available.  Imaging: DG Hand Complete Right Result Date: 07/13/2023 CLINICAL DATA:  injury EXAM: RIGHT HAND - COMPLETE 3+ VIEW COMPARISON:  None Available. FINDINGS: No acute fracture or dislocation. There is no evidence of arthropathy or other focal bone abnormality. Soft tissues are unremarkable. No radiopaque foreign body. IMPRESSION: No acute fracture or dislocation. Electronically Signed   By: Rance Burrows M.D.   On: 07/13/2023 13:52     PMFS History: Patient Active Problem List   Diagnosis Date Noted   Nondisplaced fracture of base of fourth metacarpal bone, right hand, initial encounter for closed fracture 07/14/2023   History reviewed. No pertinent past medical history.  History reviewed. No pertinent family history.  Past Surgical History:  Procedure Laterality Date   AORTOGRAM N/A 09/19/2022   Procedure: AORTOGRAM;  Surgeon: Carlene Che, MD;  Location: [facility] OR;  Service: Vascular;  Laterality: N/A;   FEMORAL-TIBIAL BYPASS GRAFT Right 09/19/2022   Procedure: right superficial femoral artery stenting.;  Surgeon: Carlene Che, MD;  Location: [facility] OR;  Service:  Vascular;  Laterality: Right;   LOWER EXTREMITY ANGIOGRAM Bilateral 09/19/2022   Procedure: LOWER EXTREMITY ANGIOGRAM;  Surgeon: Carlene Che, MD;  Location: [facility] OR;  Service: Vascular;  Laterality: Bilateral;   ULTRASOUND GUIDANCE FOR VASCULAR ACCESS Left 09/19/2022   Procedure: ULTRASOUND GUIDANCE FOR VASCULAR ACCESS;  Surgeon: Carlene Che, MD;  Location: [facility] OR;  Service: Vascular;  Laterality: Left;   Social History   Occupational History   Not on file  Tobacco Use   Smoking status: Never   Smokeless tobacco: Never  Substance and Sexual Activity   Alcohol use: Never   Drug use: Never   Sexual  activity: Not on file

## 2023-07-29 ENCOUNTER — Ambulatory Visit (INDEPENDENT_AMBULATORY_CARE_PROVIDER_SITE_OTHER): Payer: Self-pay | Admitting: Physician Assistant

## 2023-07-29 ENCOUNTER — Other Ambulatory Visit: Payer: Self-pay

## 2023-07-29 ENCOUNTER — Encounter: Payer: Self-pay | Admitting: Physician Assistant

## 2023-07-29 DIAGNOSIS — S62344A Nondisplaced fracture of base of fourth metacarpal bone, right hand, initial encounter for closed fracture: Secondary | ICD-10-CM

## 2023-07-29 NOTE — Progress Notes (Signed)
 Office Visit Note   Patient: Jason Brady           Date of Birth: [birth year]           MRN: [id number] Visit Date: 07/29/2023              Requested by: No referring provider defined for this encounter. PCP: Patient, No Pcp Per  No chief complaint on file.     HPI: Patient is a 23 year old gentleman who I have been following for questionable nondisplaced 4th and 5th metacarpal fractures.  He has been in an ulnar gutter splint.  Unfortunately he is unable to locate the splint otherwise he is doing better  Assessment & Plan: Visit Diagnoses:  1. Nondisplaced fracture of base of fourth metacarpal bone, right hand, initial encounter for closed fracture     Plan: Will provide him for 1 more splint, 2 weeks for new x-rays at that time if he is doing well he could discontinue the splint asked him that he avoid lifting with his hand  Follow-Up Instructions: No follow-ups on file.   Ortho Exam  Patient is alert, oriented, no adenopathy, well-dressed, normal affect, normal respiratory effort. Still some tenderness over the 4th and 5th metacarpals no redness no swelling sensation is intact he is able to move all of his digits extend and flex his wrist    Imaging: No results found. No images are attached to the encounter.  Labs: No results found for: HGBA1C, ESRSEDRATE, CRP, LABURIC, REPTSTATUS, GRAMSTAIN, CULT, LABORGA   No results found for: ALBUMIN, PREALBUMIN, CBC  No results found for: MG No results found for: VD25OH  No results found for: PREALBUMIN    Latest Ref Rng & Units 09/19/2022    4:02 PM 09/19/2022    3:54 PM  CBC EXTENDED  WBC 4.0 - 10.5 K/uL  7.6   RBC 4.22 - 5.81 MIL/uL  5.14   Hemoglobin 13.0 - 17.0 g/dL 01.0  27.2   HCT 53.6 - 52.0 % 42.0  41.3   Platelets 150 - 400 K/uL  216   NEUT# 1.7 - 7.7 K/uL  4.7   Lymph# 0.7 - 4.0 K/uL  1.9      There is no height or weight on file to calculate BMI.  Orders:  Orders  Placed This Encounter  Procedures   XR Hand Complete Right   No orders of the defined types were placed in this encounter.    Procedures: No procedures performed  Clinical Data: No additional findings.  ROS:  All other systems negative, except as noted in the HPI. Review of Systems  Objective: Vital Signs: There were no vitals taken for this visit.  Specialty Comments:  No specialty comments available.  PMFS History: Patient Active Problem List   Diagnosis Date Noted   Nondisplaced fracture of base of fourth metacarpal bone, right hand, initial encounter for closed fracture 07/14/2023   History reviewed. No pertinent past medical history.  History reviewed. No pertinent family history.  Past Surgical History:  Procedure Laterality Date   AORTOGRAM N/A 09/19/2022   Procedure: AORTOGRAM;  Surgeon: Carlene Che, MD;  Location: [facility] OR;  Service: Vascular;  Laterality: N/A;   FEMORAL-TIBIAL BYPASS GRAFT Right 09/19/2022   Procedure: right superficial femoral artery stenting.;  Surgeon: Carlene Che, MD;  Location: [facility] OR;  Service: Vascular;  Laterality: Right;   LOWER EXTREMITY ANGIOGRAM Bilateral 09/19/2022   Procedure: LOWER EXTREMITY ANGIOGRAM;  Surgeon: Carlene Che, MD;  Location: [facility]  OR;  Service: Vascular;  Laterality: Bilateral;   ULTRASOUND GUIDANCE FOR VASCULAR ACCESS Left 09/19/2022   Procedure: ULTRASOUND GUIDANCE FOR VASCULAR ACCESS;  Surgeon: Carlene Che, MD;  Location: [facility] OR;  Service: Vascular;  Laterality: Left;   Social History   Occupational History   Not on file  Tobacco Use   Smoking status: Never   Smokeless tobacco: Never  Substance and Sexual Activity   Alcohol use: Never   Drug use: Never   Sexual activity: Not on file

## 2023-08-12 ENCOUNTER — Ambulatory Visit: Payer: Self-pay | Admitting: Physician Assistant

## 2023-12-19 ENCOUNTER — Encounter: Payer: Self-pay | Admitting: Radiology
